# Patient Record
Sex: Female | Born: 1980 | Race: Black or African American | Hispanic: No | Marital: Single | Smoking: Never smoker
Health system: Southern US, Community
[De-identification: ages and names within clinical notes are randomized; demographics above are authoritative.]

---

## 2018-04-19 DIAGNOSIS — Z1388 Encounter for screening for disorder due to exposure to contaminants: Secondary | ICD-10-CM | POA: Diagnosis not present

## 2018-04-19 DIAGNOSIS — Z23 Encounter for immunization: Secondary | ICD-10-CM | POA: Diagnosis not present

## 2018-04-19 DIAGNOSIS — Z111 Encounter for screening for respiratory tuberculosis: Secondary | ICD-10-CM | POA: Diagnosis not present

## 2018-04-19 DIAGNOSIS — Z3009 Encounter for other general counseling and advice on contraception: Secondary | ICD-10-CM | POA: Diagnosis not present

## 2018-04-19 DIAGNOSIS — Z01419 Encounter for gynecological examination (general) (routine) without abnormal findings: Secondary | ICD-10-CM | POA: Diagnosis not present

## 2018-04-19 DIAGNOSIS — B373 Candidiasis of vulva and vagina: Secondary | ICD-10-CM | POA: Diagnosis not present

## 2018-04-19 DIAGNOSIS — Z049 Encounter for examination and observation for unspecified reason: Secondary | ICD-10-CM | POA: Diagnosis not present

## 2018-04-19 DIAGNOSIS — Z206 Contact with and (suspected) exposure to human immunodeficiency virus [HIV]: Secondary | ICD-10-CM | POA: Diagnosis not present

## 2018-04-19 DIAGNOSIS — Z0389 Encounter for observation for other suspected diseases and conditions ruled out: Secondary | ICD-10-CM | POA: Diagnosis not present

## 2018-05-05 ENCOUNTER — Other Ambulatory Visit: Payer: Self-pay | Admitting: Internal Medicine

## 2018-05-05 ENCOUNTER — Ambulatory Visit
Admission: RE | Admit: 2018-05-05 | Discharge: 2018-05-05 | Disposition: A | Discharge status: Home / Self Care | Payer: Self-pay | Source: Ambulatory Visit | Attending: Internal Medicine | Admitting: Internal Medicine

## 2018-05-05 DIAGNOSIS — Z3200 Encounter for pregnancy test, result unknown: Secondary | ICD-10-CM | POA: Diagnosis not present

## 2018-05-05 DIAGNOSIS — A150 Tuberculosis of lung: Secondary | ICD-10-CM

## 2018-05-05 DIAGNOSIS — Z30013 Encounter for initial prescription of injectable contraceptive: Secondary | ICD-10-CM | POA: Diagnosis not present

## 2018-05-06 ENCOUNTER — Ambulatory Visit
Admission: RE | Admit: 2018-05-06 | Discharge: 2018-05-06 | Disposition: A | Discharge status: Home / Self Care | Payer: Self-pay | Source: Ambulatory Visit | Attending: Internal Medicine | Admitting: Internal Medicine

## 2018-05-13 DIAGNOSIS — R7612 Nonspecific reaction to cell mediated immunity measurement of gamma interferon antigen response without active tuberculosis: Secondary | ICD-10-CM | POA: Diagnosis not present

## 2018-07-27 ENCOUNTER — Ambulatory Visit: Payer: No Typology Code available for payment source | Admitting: Family Medicine

## 2018-08-09 ENCOUNTER — Ambulatory Visit (INDEPENDENT_AMBULATORY_CARE_PROVIDER_SITE_OTHER): Payer: Medicaid Other | Admitting: Family Medicine

## 2018-08-09 ENCOUNTER — Encounter: Payer: Self-pay | Admitting: Family Medicine

## 2018-08-09 VITALS — BP 89/62 | HR 76 | Resp 17 | Ht 68.0 in | Wt 192.0 lb

## 2018-08-09 DIAGNOSIS — Z7689 Persons encountering health services in other specified circumstances: Secondary | ICD-10-CM

## 2018-08-09 DIAGNOSIS — M25511 Pain in right shoulder: Secondary | ICD-10-CM | POA: Diagnosis not present

## 2018-08-09 DIAGNOSIS — Z131 Encounter for screening for diabetes mellitus: Secondary | ICD-10-CM

## 2018-08-09 DIAGNOSIS — Z0000 Encounter for general adult medical examination without abnormal findings: Secondary | ICD-10-CM

## 2018-08-09 DIAGNOSIS — Z23 Encounter for immunization: Secondary | ICD-10-CM

## 2018-08-09 DIAGNOSIS — Z114 Encounter for screening for human immunodeficiency virus [HIV]: Secondary | ICD-10-CM

## 2018-08-09 DIAGNOSIS — Z1329 Encounter for screening for other suspected endocrine disorder: Secondary | ICD-10-CM | POA: Diagnosis not present

## 2018-08-09 DIAGNOSIS — Z13220 Encounter for screening for lipoid disorders: Secondary | ICD-10-CM | POA: Diagnosis not present

## 2018-08-09 MED ORDER — MELOXICAM 15 MG PO TABS: 15.0000 mg | ORAL_TABLET | Freq: Every day | ORAL | 0 refills | Status: DC | PRN

## 2018-08-09 MED ORDER — PREDNISONE 20 MG PO TABS: 20.0000 mg | ORAL_TABLET | Freq: Every day | ORAL | 0 refills | Status: AC

## 2018-08-09 NOTE — Patient Instructions (Addendum)
Thank you for choosing [facility] to [facility] your [facility]!    Maria Powell was seen by Joaquin CourtsKimberly Harris, FNP today.   Maria Powell's primary care provider is Bing NeighborsHarris, Kimberly S, FNP.   For the best care possible, you should try to see Joaquin CourtsKimberly Harris, FNP-C whenever you come to the clinic.   We look forward to seeing you again soon!  If you have any questions about your visit today, please call us at [phone] or feel free to reach your primary care provider via MyChart.      Shoulder Pain Many things can cause shoulder pain, including:  An injury.  Moving the arm in the same way again and again (overuse).  Joint pain (arthritis).  Follow these instructions at home: Take these actions to help with your pain:  Squeeze a soft ball or a foam pad as much as you can. This helps to prevent swelling. It also makes the arm stronger.  Take over-the-counter and prescription medicines only as told by your doctor.  If told, put ice on the area: ? Put ice in a plastic bag. ? Place a towel between your skin and the bag. ? Leave the ice on for 20 minutes, 2-3 times per day. Stop putting on ice if it does not help with the pain.  If you were given a shoulder sling or immobilizer: ? Wear it as told. ? Remove it to shower or bathe. ? Move your arm as little as possible. ? Keep your hand moving. This helps prevent swelling.  Contact a doctor if:  Your pain gets worse.  Medicine does not help your pain.  You have new pain in your arm, hand, or fingers. Get help right away if:  Your arm, hand, or fingers: ? Tingle. ? Are numb. ? Are swollen. ? Are painful. ? Turn white or blue. This information is not intended to replace advice given to you by your health care provider. Make sure you discuss any questions you have with your health care provider. Document Released: 02/25/2008 Document Revised: 05/04/2016 Document Reviewed: 01/01/2015 Elsevier  Interactive Patient Education  [employer]2018 Elsevier Inc.

## 2018-08-09 NOTE — Progress Notes (Signed)
Maria Powell, is a 37 y.o. female  UJW:[id number]  FAO:[id number]  DOB - 1981/05/19  CC:  Chief Complaint  Patient presents with  . Establish Care    no concerns. did want flu shot today       HPI: Maria Powell is a 37 y.o. female is here today to establish care.   Maria Powell does not have a problem list on file.    Today's visit:  [name] complains today of shoulder pain.  She works at a [employer]. She complains of on-going right sided shoulder pain.  Occasional numbness in digits which resolve once pain subsides. She has applied ice and this improves pain for short time.  She denies any prior injury. This pain is been present since she is been working at the [employer] the course of the last 3 months.  She feels that the pain is getting worse and is interfering with her ability to perform her work. Patient denies new headaches, chest pain, abdominal pain, nausea, new weakness , SOB, edema, or worrisome cough.  Current medications:No current outpatient medications on file.   Pertinent family medical history: Immigrant from [place]. No known family medical history.   No Known Allergies  Social History   Socioeconomic History  . Marital status: Single    Spouse name: Not on file  . Number of children: Not on file  . Years of education: Not on file  . Highest education level: Not on file  Occupational History  . Not on file  Social Needs  . Financial resource strain: Not on file  . Food insecurity:    Worry: Not on file    Inability: Not on file  . Transportation needs:    Medical: Not on file    Non-medical: Not on file  Tobacco Use  . Smoking status: Never Smoker  . Smokeless tobacco: Never Used  Substance and Sexual Activity  . Alcohol use: Never    Frequency: Never  . Drug use: Never  . Sexual activity: Yes    Partners: Male  Lifestyle  . Physical activity:    Days per week: Not on file    Minutes per session: Not on file  .  Stress: Not on file  Relationships  . Social connections:    Talks on phone: Not on file    Gets together: Not on file    Attends religious service: Not on file    Active member of club or organization: Not on file    Attends meetings of clubs or organizations: Not on file    Relationship status: Not on file  . Intimate partner violence:    Fear of current or ex partner: Not on file    Emotionally abused: Not on file    Physically abused: Not on file    Forced sexual activity: Not on file  Other Topics Concern  . Not on file  Social History Narrative  . Not on file    Review of Systems: Constitutional: Negative for fever, chills, diaphoresis, activity change, appetite change and fatigue. HENT: Negative for ear pain, nosebleeds, congestion, facial swelling, rhinorrhea, neck pain, neck stiffness and ear discharge.  Eyes: Negative for pain, discharge, redness, itching and visual disturbance. Respiratory: Negative for cough, choking, chest tightness, shortness of breath, wheezing and stridor.  Cardiovascular: Negative for chest pain, palpitations and leg swelling. Gastrointestinal: Negative for abdominal distention. Genitourinary: Negative for dysuria, urgency, frequency, hematuria, flank pain, decreased urine volume, difficulty urinating. Musculoskeletal: right shoulder pain. Right  arm and hand numbness intermittently Neurological: Negative for dizziness, tremors, seizures, syncope, facial asymmetry, speech difficulty, weakness, light-headedness, numbness and headaches.  Psychiatric/Behavioral: Negative for hallucinations, behavioral problems, confusion, dysphoric mood, decreased concentration and agitation.  Objective:   Vitals:   08/09/18 1132  BP: (!) 89/62  Pulse: 76  Resp: 17  SpO2: 98%    BP Readings from Last 3 Encounters:  08/09/18 (!) 89/62    Filed Weights   08/09/18 1132  Weight: 192 lb (87.1 kg)      Physical Exam: Constitutional: Patient appears  well-developed and well-nourished. No distress. HENT: Normocephalic, atraumatic, External right and left ear normal. Oropharynx is clear and moist.  Eyes: Conjunctivae and EOM are normal. PERRLA, no scleral icterus. Neck: Normal ROM. Neck supple. No JVD. No tracheal deviation. No thyromegaly. CVS: RRR, S1/S2 +, no murmurs, no gallops, no carotid bruit.  Pulmonary: Effort and breath sounds normal, no stridor, rhonchi, wheezes, rales.  Abdominal: Soft. BS +, no distension, tenderness, rebound or guarding.  Musculoskeletal: Normal range of motion. No edema and no tenderness.  Neuro: Alert. Normal muscle tone coordination. Normal gait. BUE and BLE strength 5/5. Bilateral hand grips symmetrical. No cranial nerve deficit. Skin: Skin is warm and dry. No rash noted. Not diaphoretic. No erythema. No pallor. Psychiatric: Normal mood and affect. Behavior, judgment, thought content normal.     Assessment and plan:  1. Encounter to establish care 2. Need for prophylactic vaccination and inoculation against influenza - Flu Vaccine QUAD 6+ mos PF IM (Fluarix Quad PF)  3. Lipid screening - Lipid Panel  4. Thyroid disorder screen - TSH  5. Diabetes mellitus screening - Hemoglobin A1c  6. Laboratory tests ordered as part of a complete physical exam (CPE) - Comprehensive metabolic panel - CBC with Differential - Urinalysis  7. Screening for HIV (human immunodeficiency virus) - HIV antibody (with reflex)  8. Acute pain of right shoulder, will trial prednisone taper initially and advised to start PRN Meloxicam once prednisone is finished. If no improvement with this course of treatment, will refer to orthopedic.  A total of 30 minutes spent, greater than 50 % of this time was spent counseling, coordination of care. Used  interpreter to facilitate continuum of care and promote health.   Meds ordered this encounter  Medications  . meloxicam (MOBIC) 15 MG tablet    Sig: Take 1 tablet (15 mg  total) by mouth daily as needed for pain. Start after prednisone is completed    Dispense:  30 tablet    Refill:  0  . predniSONE (DELTASONE) 20 MG tablet    Sig: Take 1 tablet (20 mg total) by mouth daily with breakfast for 5 days.    Dispense:  5 tablet    Refill:  0    F/u based on lab results  The patient was given clear instructions to go to ER or return to medical center if symptoms don't improve, worsen or new problems develop. The patient verbalized understanding. The patient was advised  to call and obtain lab results if they haven't heard anything from out office within 7-10 business days.  Joaquin CourtsKimberly Mady Oubre, FNP [facility] [address], [place] [id number] [phone]: [phone]    This note has been created with Dragon speech recognition software and Paediatric nursesmart phrase technology. Any transcriptional errors are unintentional.

## 2018-08-10 LAB — COMPREHENSIVE METABOLIC PANEL
ALT: 24 IU/L (ref 0–32)
AST: 22 IU/L (ref 0–40)
Albumin/Globulin Ratio: 1.1 — ABNORMAL LOW (ref 1.2–2.2)
Albumin: 4 g/dL (ref 3.5–5.5)
Alkaline Phosphatase: 57 IU/L (ref 39–117)
BUN/Creatinine Ratio: 23 (ref 9–23)
BUN: 15 mg/dL (ref 6–20)
Bilirubin Total: 0.5 mg/dL (ref 0.0–1.2)
CALCIUM: 8.9 mg/dL (ref 8.7–10.2)
CO2: 22 mmol/L (ref 20–29)
CREATININE: 0.65 mg/dL (ref 0.57–1.00)
Chloride: 102 mmol/L (ref 96–106)
GFR calc Af Amer: 131 mL/min/{1.73_m2} (ref >59)
GFR, EST NON AFRICAN AMERICAN: 114 mL/min/{1.73_m2} (ref >59)
Globulin, Total: 3.5 g/dL (ref 1.5–4.5)
Glucose: 100 mg/dL — ABNORMAL HIGH (ref 65–99)
POTASSIUM: 4.4 mmol/L (ref 3.5–5.2)
Sodium: 140 mmol/L (ref 134–144)
Total Protein: 7.5 g/dL (ref 6.0–8.5)

## 2018-08-10 LAB — CBC WITH DIFFERENTIAL/PLATELET
Basophils Absolute: 0 10*3/uL (ref 0.0–0.2)
Basos: 1 %
EOS (ABSOLUTE): 0.1 10*3/uL (ref 0.0–0.4)
EOS: 3 %
HEMATOCRIT: 38.7 % (ref 34.0–46.6)
Hemoglobin: 12.5 g/dL (ref 11.1–15.9)
IMMATURE GRANULOCYTES: 0 %
Immature Grans (Abs): 0 10*3/uL (ref 0.0–0.1)
Lymphocytes Absolute: 1.9 10*3/uL (ref 0.7–3.1)
Lymphs: 37 %
MCH: 25.5 pg — ABNORMAL LOW (ref 26.6–33.0)
MCHC: 32.3 g/dL (ref 31.5–35.7)
MCV: 79 fL (ref 79–97)
MONOCYTES: 11 %
MONOS ABS: 0.6 10*3/uL (ref 0.1–0.9)
NEUTROS PCT: 48 %
Neutrophils Absolute: 2.5 10*3/uL (ref 1.4–7.0)
Platelets: 451 10*3/uL — ABNORMAL HIGH (ref 150–450)
RBC: 4.9 x10E6/uL (ref 3.77–5.28)
RDW: 12.8 % (ref 12.3–15.4)
WBC: 5.2 10*3/uL (ref 3.4–10.8)

## 2018-08-10 LAB — URINALYSIS
Bilirubin, UA: NEGATIVE
Glucose, UA: NEGATIVE
KETONES UA: NEGATIVE
Leukocytes, UA: NEGATIVE
NITRITE UA: NEGATIVE
Protein, UA: NEGATIVE
Specific Gravity, UA: 1.026 (ref 1.005–1.030)
UUROB: 0.2 mg/dL (ref 0.2–1.0)
pH, UA: 5.5 (ref 5.0–7.5)

## 2018-08-10 LAB — HEMOGLOBIN A1C
ESTIMATED AVERAGE GLUCOSE: 137 mg/dL
HEMOGLOBIN A1C: 6.4 % — AB (ref 4.8–5.6)

## 2018-08-10 LAB — LIPID PANEL
CHOL/HDL RATIO: 4.1 ratio (ref 0.0–4.4)
Cholesterol, Total: 202 mg/dL — ABNORMAL HIGH (ref 100–199)
HDL: 49 mg/dL (ref >39)
LDL CALC: 131 mg/dL — AB (ref 0–99)
Triglycerides: 112 mg/dL (ref 0–149)
VLDL Cholesterol Cal: 22 mg/dL (ref 5–40)

## 2018-08-10 LAB — HIV ANTIBODY (ROUTINE TESTING W REFLEX): HIV SCREEN 4TH GENERATION: NONREACTIVE

## 2018-08-10 LAB — TSH: TSH: 1.32 u[IU]/mL (ref 0.450–4.500)

## 2018-08-16 ENCOUNTER — Telehealth: Payer: Self-pay | Admitting: Family Medicine

## 2018-08-16 NOTE — Telephone Encounter (Signed)
Please contact patient to schedule a return visit to discuss recent labs. Next week is great if possible with an interpreter.

## 2018-08-25 ENCOUNTER — Ambulatory Visit: Payer: Medicaid Other | Admitting: Family Medicine

## 2018-08-27 ENCOUNTER — Encounter: Payer: Self-pay | Admitting: Family Medicine

## 2018-08-27 NOTE — Progress Notes (Signed)
Letter mailed requesting patient to come in office to discuss labs. She was scheduled to be seen and no showed follow-up appt. Given language barrier wanted to discuss labs and make medication changes face to face.

## 2019-05-01 IMAGING — CR DG CHEST 1V
1 series · 1 of 1 positions shown · non-contrast
Comparison: None.

CLINICAL DATA: Positive PPD

EXAM:
CHEST  1 VIEW

[w chest pa]
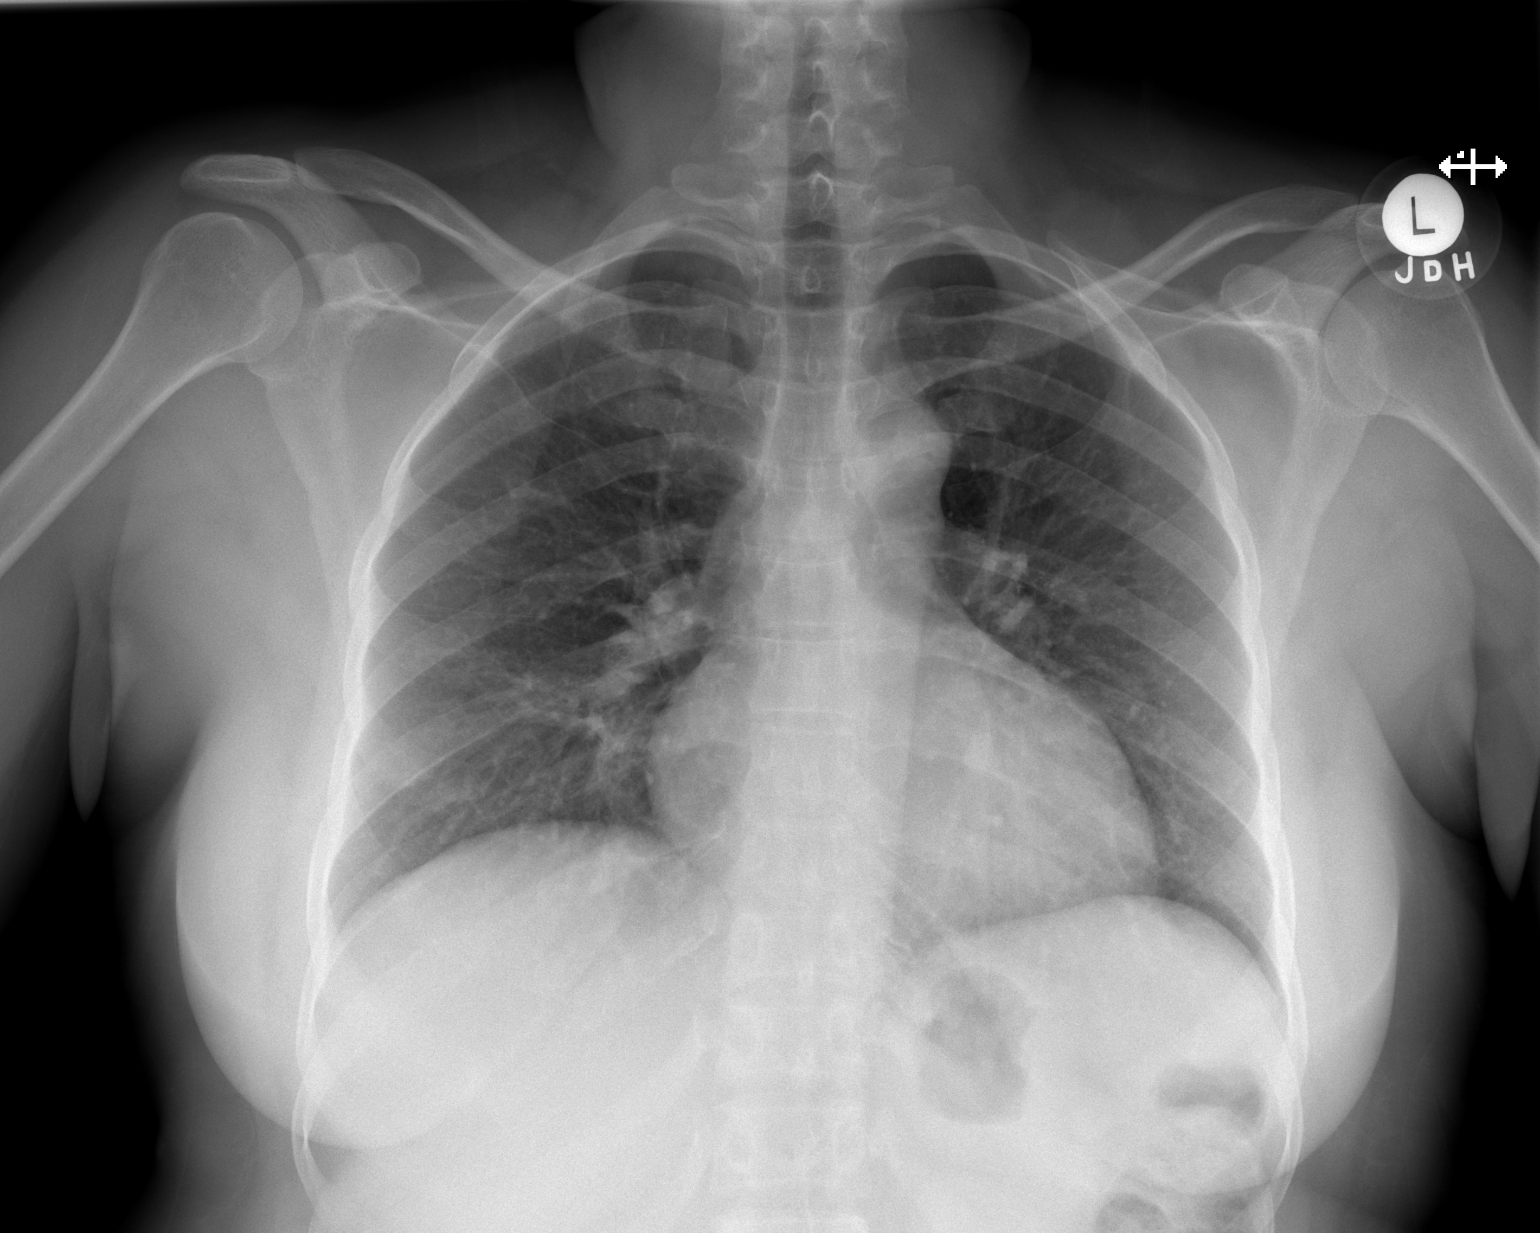

[1 of 1 positions shown; findings below may reference images not displayed]

FINDINGS: Heart and mediastinal contours are within normal limits. No focal
opacities or effusions. No acute bony abnormality.
IMPRESSION: No active disease.

## 2021-11-25 DIAGNOSIS — Z23 Encounter for immunization: Secondary | ICD-10-CM | POA: Diagnosis not present

## 2023-08-07 ENCOUNTER — Ambulatory Visit: Payer: Medicaid Other

## 2023-08-07 DIAGNOSIS — Z23 Encounter for immunization: Secondary | ICD-10-CM

## 2023-08-07 DIAGNOSIS — Z719 Counseling, unspecified: Secondary | ICD-10-CM

## 2023-08-07 NOTE — Progress Notes (Addendum)
In nurse clinic for immunizations for immigration.Accompanied by 3 children. Oldest son interpreted for pt. Declined interpreter services,signed declination. Vaccines given Flu,Covid, and Polio. Tolerated vaccines well. Observed following covid vaccine,no adverse effects noted.NCIR updated and copies given to pt.

## 2024-07-30 ENCOUNTER — Emergency Department (HOSPITAL_COMMUNITY)

## 2024-07-30 ENCOUNTER — Other Ambulatory Visit: Payer: Self-pay

## 2024-07-30 ENCOUNTER — Encounter (HOSPITAL_COMMUNITY): Payer: Self-pay | Admitting: Emergency Medicine

## 2024-07-30 ENCOUNTER — Inpatient Hospital Stay (HOSPITAL_COMMUNITY)
Admission: EM | Admit: 2024-07-30 | Discharge: 2024-08-02 | DRG: 872 | Disposition: A | Discharge status: Home / Self Care | Attending: Family Medicine | Admitting: Family Medicine

## 2024-07-30 DIAGNOSIS — N12 Tubulo-interstitial nephritis, not specified as acute or chronic: Secondary | ICD-10-CM | POA: Diagnosis not present

## 2024-07-30 DIAGNOSIS — Z791 Long term (current) use of non-steroidal anti-inflammatories (NSAID): Secondary | ICD-10-CM

## 2024-07-30 DIAGNOSIS — R1084 Generalized abdominal pain: Secondary | ICD-10-CM | POA: Diagnosis not present

## 2024-07-30 DIAGNOSIS — E785 Hyperlipidemia, unspecified: Secondary | ICD-10-CM | POA: Diagnosis present

## 2024-07-30 DIAGNOSIS — Z1152 Encounter for screening for COVID-19: Secondary | ICD-10-CM | POA: Diagnosis not present

## 2024-07-30 DIAGNOSIS — E860 Dehydration: Secondary | ICD-10-CM | POA: Diagnosis present

## 2024-07-30 DIAGNOSIS — R7303 Prediabetes: Secondary | ICD-10-CM | POA: Insufficient documentation

## 2024-07-30 DIAGNOSIS — A419 Sepsis, unspecified organism: Principal | ICD-10-CM | POA: Diagnosis present

## 2024-07-30 DIAGNOSIS — R0989 Other specified symptoms and signs involving the circulatory and respiratory systems: Secondary | ICD-10-CM | POA: Diagnosis not present

## 2024-07-30 DIAGNOSIS — M549 Dorsalgia, unspecified: Secondary | ICD-10-CM | POA: Diagnosis not present

## 2024-07-30 DIAGNOSIS — R519 Headache, unspecified: Secondary | ICD-10-CM | POA: Diagnosis not present

## 2024-07-30 DIAGNOSIS — R112 Nausea with vomiting, unspecified: Secondary | ICD-10-CM | POA: Diagnosis not present

## 2024-07-30 DIAGNOSIS — R935 Abnormal findings on diagnostic imaging of other abdominal regions, including retroperitoneum: Secondary | ICD-10-CM | POA: Diagnosis not present

## 2024-07-30 DIAGNOSIS — N10 Acute tubulo-interstitial nephritis: Secondary | ICD-10-CM | POA: Diagnosis not present

## 2024-07-30 DIAGNOSIS — K5900 Constipation, unspecified: Secondary | ICD-10-CM | POA: Diagnosis not present

## 2024-07-30 DIAGNOSIS — E119 Type 2 diabetes mellitus without complications: Secondary | ICD-10-CM | POA: Diagnosis present

## 2024-07-30 DIAGNOSIS — R000 Tachycardia, unspecified: Secondary | ICD-10-CM | POA: Diagnosis not present

## 2024-07-30 DIAGNOSIS — R140 Abdominal distension (gaseous): Secondary | ICD-10-CM | POA: Diagnosis not present

## 2024-07-30 LAB — CBC WITH DIFFERENTIAL/PLATELET
Abs Immature Granulocytes: 0.15 K/uL — ABNORMAL HIGH (ref 0.00–0.07)
Basophils Absolute: 0.1 K/uL (ref 0.0–0.1)
Basophils Relative: 0 %
Eosinophils Absolute: 0 K/uL (ref 0.0–0.5)
Eosinophils Relative: 0 %
HCT: 36.2 % (ref 36.0–46.0)
Hemoglobin: 11.6 g/dL — ABNORMAL LOW (ref 12.0–15.0)
Immature Granulocytes: 1 %
Lymphocytes Relative: 7 %
Lymphs Abs: 1.3 K/uL (ref 0.7–4.0)
MCH: 26.3 pg (ref 26.0–34.0)
MCHC: 32 g/dL (ref 30.0–36.0)
MCV: 82.1 fL (ref 80.0–100.0)
Monocytes Absolute: 2.3 K/uL — ABNORMAL HIGH (ref 0.1–1.0)
Monocytes Relative: 13 %
Neutro Abs: 14.2 K/uL — ABNORMAL HIGH (ref 1.7–7.7)
Neutrophils Relative %: 79 %
Platelets: 293 K/uL (ref 150–400)
RBC: 4.41 MIL/uL (ref 3.87–5.11)
RDW: 13.2 % (ref 11.5–15.5)
WBC: 18 K/uL — ABNORMAL HIGH (ref 4.0–10.5)
nRBC: 0 % (ref 0.0–0.2)

## 2024-07-30 LAB — GLUCOSE, CAPILLARY
Glucose-Capillary: 135 mg/dL — ABNORMAL HIGH (ref 70–99)
Glucose-Capillary: 138 mg/dL — ABNORMAL HIGH (ref 70–99)

## 2024-07-30 LAB — RESP PANEL BY RT-PCR (RSV, FLU A&B, COVID)  RVPGX2
Influenza A by PCR: NEGATIVE
Influenza B by PCR: NEGATIVE
Resp Syncytial Virus by PCR: NEGATIVE
SARS Coronavirus 2 by RT PCR: NEGATIVE

## 2024-07-30 LAB — COMPREHENSIVE METABOLIC PANEL WITH GFR
ALT: 37 U/L (ref 0–44)
AST: 38 U/L (ref 15–41)
Albumin: 2.5 g/dL — ABNORMAL LOW (ref 3.5–5.0)
Alkaline Phosphatase: 125 U/L (ref 38–126)
Anion gap: 13 (ref 5–15)
BUN: 14 mg/dL (ref 6–20)
CO2: 21 mmol/L — ABNORMAL LOW (ref 22–32)
Calcium: 8.5 mg/dL — ABNORMAL LOW (ref 8.9–10.3)
Chloride: 102 mmol/L (ref 98–111)
Creatinine, Ser: 1 mg/dL (ref 0.44–1.00)
GFR, Estimated: >60 mL/min (ref >60)
Glucose, Bld: 136 mg/dL — ABNORMAL HIGH (ref 70–99)
Potassium: 4.2 mmol/L (ref 3.5–5.1)
Sodium: 136 mmol/L (ref 135–145)
Total Bilirubin: 0.6 mg/dL (ref 0.0–1.2)
Total Protein: 7.2 g/dL (ref 6.5–8.1)

## 2024-07-30 LAB — URINALYSIS, W/ REFLEX TO CULTURE (INFECTION SUSPECTED)
Bacteria, UA: NONE SEEN
Bilirubin Urine: NEGATIVE
Glucose, UA: NEGATIVE mg/dL
Hgb urine dipstick: NEGATIVE
Ketones, ur: NEGATIVE mg/dL
Nitrite: NEGATIVE
Protein, ur: 30 mg/dL — AB
Specific Gravity, Urine: 1.011 (ref 1.005–1.030)
pH: 7 (ref 5.0–8.0)

## 2024-07-30 LAB — HEMOGLOBIN A1C
Hgb A1c MFr Bld: 6.6 % — ABNORMAL HIGH (ref 4.8–5.6)
Mean Plasma Glucose: 142.72 mg/dL

## 2024-07-30 LAB — PROTIME-INR
INR: >10 (ref 0.8–1.2)
INR: 1.2 (ref 0.8–1.2)
Prothrombin Time: >90 s — ABNORMAL HIGH (ref 11.4–15.2)
Prothrombin Time: 15.4 s — ABNORMAL HIGH (ref 11.4–15.2)

## 2024-07-30 LAB — HCG, SERUM, QUALITATIVE: Preg, Serum: NEGATIVE

## 2024-07-30 LAB — I-STAT CG4 LACTIC ACID, ED
Lactic Acid, Venous: 1.2 mmol/L (ref 0.5–1.9)
Lactic Acid, Venous: 1.3 mmol/L (ref 0.5–1.9)

## 2024-07-30 LAB — HIV ANTIBODY (ROUTINE TESTING W REFLEX): HIV Screen 4th Generation wRfx: NONREACTIVE

## 2024-07-30 MED ORDER — LACTATED RINGERS IV SOLN: INTRAVENOUS | Status: DC

## 2024-07-30 MED ORDER — ENOXAPARIN SODIUM 40 MG/0.4ML IJ SOSY
40.0000 mg | PREFILLED_SYRINGE | INTRAMUSCULAR | Status: DC
  Filled 2024-07-30: qty 0.4

## 2024-07-30 MED ORDER — INSULIN ASPART 100 UNIT/ML IJ SOLN
0.0000 [IU] | Freq: Three times a day (TID) | INTRAMUSCULAR | Status: DC
  Administered 2024-07-30: 1 [IU] via SUBCUTANEOUS
  Filled 2024-07-30: qty 1

## 2024-07-30 MED ORDER — METRONIDAZOLE 500 MG/100ML IV SOLN
500.0000 mg | Freq: Once | INTRAVENOUS | Status: AC
  Administered 2024-07-30: 500 mg via INTRAVENOUS
  Filled 2024-07-30: qty 100

## 2024-07-30 MED ORDER — LACTATED RINGERS IV BOLUS (SEPSIS)
1000.0000 mL | Freq: Once | INTRAVENOUS | Status: AC
  Administered 2024-07-30: 1000 mL via INTRAVENOUS

## 2024-07-30 MED ORDER — MORPHINE SULFATE (PF) 4 MG/ML IV SOLN
4.0000 mg | Freq: Once | INTRAVENOUS | Status: AC
  Administered 2024-07-30: 4 mg via INTRAVENOUS
  Filled 2024-07-30: qty 1

## 2024-07-30 MED ORDER — SODIUM CHLORIDE 0.9 % IV SOLN
2.0000 g | Freq: Once | INTRAVENOUS | Status: AC
  Administered 2024-07-30: 2 g via INTRAVENOUS
  Filled 2024-07-30: qty 12.5

## 2024-07-30 MED ORDER — MORPHINE SULFATE (PF) 4 MG/ML IV SOLN: 4.0000 mg | INTRAVENOUS | Status: DC | PRN

## 2024-07-30 MED ORDER — ONDANSETRON HCL 4 MG/2ML IJ SOLN: 4.0000 mg | Freq: Three times a day (TID) | INTRAMUSCULAR | Status: DC | PRN

## 2024-07-30 MED ORDER — OXYCODONE HCL 5 MG PO TABS
5.0000 mg | ORAL_TABLET | Freq: Four times a day (QID) | ORAL | Status: DC | PRN
  Administered 2024-07-30 – 2024-08-01 (×6): 5 mg via ORAL
  Filled 2024-07-30 (×6): qty 1

## 2024-07-30 MED ORDER — IOHEXOL 350 MG/ML SOLN
75.0000 mL | Freq: Once | INTRAVENOUS | Status: AC | PRN
  Administered 2024-07-30: 75 mL via INTRAVENOUS

## 2024-07-30 MED ORDER — ACETAMINOPHEN 500 MG PO TABS
1000.0000 mg | ORAL_TABLET | Freq: Four times a day (QID) | ORAL | Status: DC
  Administered 2024-07-30 – 2024-08-02 (×10): 1000 mg via ORAL
  Filled 2024-07-30 (×10): qty 2

## 2024-07-30 MED ORDER — KETOROLAC TROMETHAMINE 15 MG/ML IJ SOLN
15.0000 mg | Freq: Once | INTRAMUSCULAR | Status: AC
  Administered 2024-07-30: 15 mg via INTRAVENOUS
  Filled 2024-07-30: qty 1

## 2024-07-30 MED ORDER — SODIUM CHLORIDE 0.9 % IV SOLN
2.0000 g | INTRAVENOUS | Status: DC
  Administered 2024-07-30 – 2024-08-01 (×3): 2 g via INTRAVENOUS
  Filled 2024-07-30 (×3): qty 20

## 2024-07-30 NOTE — H&P (Signed)
 Hospital Admission History and Physical Service Pager: [phone]  Patient name: Maria Powell Medical record number: [id number] Date of Birth: [birth year] Age: 43 y.o. Gender: female  Primary Care Provider: Arloa Suzen RAMAN, NP (Inactive) Consultants: None Code Status: Full code Preferred Emergency Contact:  Laureen (son - 33 yo) - [phone] (does speak English) Trenesha Alcaide (son - 30 yo) - [phone] (does speak English) Contact Information     Name Relation Home Work Mobile   Chhetri,Birendra Friend   [phone]      Other Contacts   None on File      Chief Complaint: Abdominal pain  Assessment and Plan: Madia Carvell is a 43 y.o. female with PMHx prediabetes and HLD presenting with abdominal pain and nausea meeting sepsis criteria.   Differential for presentation of this includes: Pyelonephritis: CTAP confirmed bilateral pyelonephritis and patient with dysuria x 1 week.  Most likely source Meningitis: Endorsing headache, likely in the setting of dehydration as negative for meningeal signs and with other more likely source Pneumonia: Denies cough congestion or other respiratory symptoms, unlikely.  Assessment & Plan Sepsis (HCC) Bilateral Pyelonephritis Meets sepsis criteria given can tachycardia, leukocytosis and softer BP with bilateral pyelonephritis as likely source.  S/p cefepime and Flagyl per the ED, will consolidate to ceftriaxone and narrow as able pending blood and urine cultures.  Ongoing nausea, will treat symptomatically and continue MIVF. - Admitted to FMTS, Dr. Anders attending - S/p cefepime/Flagyl (11/8), transition to ceftriaxone (11/9-) - Follow-up blood and urine culture - Pain management: Scheduled Tylenol 1G every 6 hour, oxycodone 5 mg as needed every 6 hour moderate/severe pain, morphine 4 mg IV as needed every 4 hours breakthrough pain - Zofran 4 mg IV every 8 hours as needed - Continue LR MIVF, d/c when taking  PO Prediabetes A1c 6.4 in 2019, possibly progressed to T2DM as a risk factor for pyelonephritis as above. - A1c - CBGs with sSSI   FEN/GI: Clear liquid VTE Prophylaxis: Lovenox  Disposition: Progressive  History of Present Illness:  Maria Powell is a 43 y.o. female presenting with abdominal pain  Abdominal pain and nausea x 2 days. Some HA in the same time period with global weakness. Having dysuria x 1 week. No diarrhea. Had some vomiting at home. Denies hematuria. Having chills but denies fever. Not eating and drinking much. No similar prior presentation. Denies history of urinary retention.   In the ED, met criteria for sepsis given tachycardia, soft BP and leukocytosis.  CTAP obtained that showed bilateral pyelonephritis initiated on cefepime and Flagyl.  Obtained blood culture and urine culture.  S/p LR 1L bolus x 3 and started on MIVF.  Concern for urinary retention, Foley catheter placed.  Review Of Systems: Per HPI  Pertinent Past Medical History: Prediabetes HLD Refugee Remainder reviewed in history tab.   Pertinent Past Surgical History: C/S x 2 Remainder reviewed in history tab.   Pertinent Social History: Tobacco use: Never Alcohol use: None Other Substance use: None Lives with children (20, 31, 39, 67 yo)  Pertinent Family History: Denies h/o renal disease, DM and heart disease Remainder reviewed in history tab.   Important Outpatient Medications: None Remainder reviewed in medication history.   Objective: BP 104/82   Pulse (!) 104   Temp 99.2 F (37.3 C)   Resp 16   SpO2 100%  Exam: General: Ill-appearing, NAD Eyes: Anicteric sclera.  PERRLA.  EOMI. ENTM: Dry mucous membranes.  Poor dentition. Neck: Full ROM of neck without pain. Cardiovascular:  Tachycardia.  Regular rhythm.  No murmur. Respiratory: CTAB.  Normal work of breathing on room air. Gastrointestinal: Soft, nondistended.  Tender to palpation over lower abdomen extending around to  the back.  + CVA tenderness bilaterally.  No rebound tenderness or guarding MSK: No peripheral edema Derm: Warm, dry, no rashes Neuro: CN intact.  Motor and sensation intact globally.  5/5 grip strength and hip flexion bilaterally Psych: Fatigued.  Appropriately responds to questions  Labs:  CBC BMET  Recent Labs  Lab 07/30/24 0831  WBC 18.0*  HGB 11.6*  HCT 36.2  PLT 293   Recent Labs  Lab 07/30/24 0831  NA 136  K 4.2  CL 102  CO2 21*  BUN 14  CREATININE 1.00  GLUCOSE 136*  CALCIUM 8.5*    Pertinent additional labs: Lactic acid: 1.2 > 1.3  EKG: Sinus tachycardia.    Imaging Studies Performed: CT CHEST ABDOMEN PELVIS W CONTRAST Result Date: 07/30/2024 IMPRESSION: 1. Bilateral Acute Pyelonephritis, severe on the right, with inflammation tracking distally along the right ureter. No renal abscess. No urinary calculus or obstructing etiology identified. 2. Urinary bladder distended (552 mL), query urinary retention. 3. Bilateral lung opacity more resembling atelectasis than infection. 4. Mild cardiomegaly. Electronically signed by: Helayne Hurst MD 07/30/2024 12:31 PM EST RP Workstation: [id number]   CT Head Wo Contrast Result Date: 07/30/2024 IMPRESSION: 1. Normal non-contrast head CT.      Theophilus Pagan, MD 07/30/2024, 1:10 PM PGY-3, [facility]  FPTS Intern pager: [phone], text pages welcome Secure chat group [facility] Teaching Service

## 2024-07-30 NOTE — Plan of Care (Signed)
 FMTS Interim Progress Note  S: Night rounding. No acute complaints.  O: BP 93/61 (BP Location: Left Arm)   Pulse 92   Temp 99.4 F (37.4 C) (Oral)   Resp 18   SpO2 97%   Gen: Woman laying in bed, resting comfortably. NAD. On RA.   A/P: Sepsis  Pyelonephritis VSS, afebrile. Plan to transition to CTX tomrrow. Ucx and Bcx pending.   Elicia Hamlet, MD 07/30/2024, 9:45 PM PGY-3, [facility] Family Medicine Service pager [phone]

## 2024-07-30 NOTE — ED Notes (Signed)
 Bed side commode placed in room so we can get a urin sample

## 2024-07-30 NOTE — ED Notes (Signed)
 Pt in CT.

## 2024-07-30 NOTE — Plan of Care (Signed)
   Problem: Education: Goal: Ability to describe self-care measures that may prevent or decrease complications (Diabetes Survival Skills Education) will improve Outcome: Progressing

## 2024-07-30 NOTE — ED Notes (Signed)
 Bladder scan 79mL, decreased visibility due to abdominal swelling.

## 2024-07-30 NOTE — Assessment & Plan Note (Signed)
 A1c 6.4 in 2019, possibly progressed to T2DM as a risk factor for pyelonephritis as above. - A1c - CBGs with sSSI

## 2024-07-30 NOTE — Assessment & Plan Note (Signed)
 Meets sepsis criteria given can tachycardia, leukocytosis and softer BP with bilateral pyelonephritis as likely source.  S/p cefepime and Flagyl per the ED, will consolidate to ceftriaxone and narrow as able pending blood and urine cultures.  Ongoing nausea, will treat symptomatically and continue MIVF. - Admitted to FMTS, Dr. Anders attending - S/p cefepime/Flagyl (11/8), transition to ceftriaxone (11/9-) - Follow-up blood and urine culture - Pain management: Scheduled Tylenol 1G every 6 hour, oxycodone 5 mg as needed every 6 hour moderate/severe pain, morphine 4 mg IV as needed every 4 hours breakthrough pain - Zofran 4 mg IV every 8 hours as needed - Continue LR MIVF, d/c when taking PO

## 2024-07-30 NOTE — ED Notes (Signed)
 Red and light green tubes sent down to lab for hcg.

## 2024-07-30 NOTE — Sepsis Progress Note (Signed)
 Elink following code stroke

## 2024-07-30 NOTE — ED Provider Notes (Signed)
 [place] EMERGENCY DEPARTMENT AT [facility] Provider Note   CSN: [id number] Arrival date & time: 07/30/24  9192     Patient presents with: Abdominal Pain   Maria Powell is a 43 y.o. female with past medical history significant for previous C-section, otherwise noncontributory who presents with concern for 2 days of abdominal pain, nausea, vomiting, she denies diarrhea.  She does feel some fever, chills.  She endorses some headache, denies vision changes, numbness, tingling.  She denies any neck pain.  She does endorse some dysuria.    Abdominal Pain      Prior to Admission medications   Medication Sig Start Date End Date Taking? Authorizing Provider  meloxicam  (MOBIC ) 15 MG tablet Take 1 tablet (15 mg total) by mouth daily as needed for pain. Start after prednisone  is completed Patient not taking: Reported on 08/07/2023 08/09/18   Arloa Suzen RAMAN, NP    Allergies: Patient has no known allergies.    Review of Systems  Gastrointestinal:  Positive for abdominal pain.  All other systems reviewed and are negative.   Updated Vital Signs BP 104/82   Pulse (!) 104   Temp 99.2 F (37.3 C)   Resp 16   SpO2 100%   Physical Exam Vitals and nursing note reviewed.  Constitutional:      General: She is not in acute distress.    Appearance: Normal appearance.  HENT:     Head: Normocephalic and atraumatic.  Eyes:     General:        Right eye: No discharge.        Left eye: No discharge.  Cardiovascular:     Rate and Rhythm: Normal rate and regular rhythm.     Heart sounds: No murmur heard.    No friction rub. No gallop.  Pulmonary:     Effort: Pulmonary effort is normal.     Breath sounds: Normal breath sounds.  Abdominal:     General: Bowel sounds are normal.     Palpations: Abdomen is soft.     Comments: Diffuse tenderness to palpation throughout the abdomen, most focally in the left lower quadrant, epigastric region, no rebound, rigidity,  guarding  Skin:    General: Skin is warm and dry.     Capillary Refill: Capillary refill takes less than 2 seconds.  Neurological:     Mental Status: She is alert and oriented to person, place, and time.  Psychiatric:        Mood and Affect: Mood normal.        Behavior: Behavior normal.     (all labs ordered are listed, but only abnormal results are displayed) Labs Reviewed  COMPREHENSIVE METABOLIC PANEL WITH GFR - Abnormal; Notable for the following components:      Result Value   CO2 21 (*)    Glucose, Bld 136 (*)    Calcium 8.5 (*)    Albumin 2.5 (*)    All other components within normal limits  CBC WITH DIFFERENTIAL/PLATELET - Abnormal; Notable for the following components:   WBC 18.0 (*)    Hemoglobin 11.6 (*)    Neutro Abs 14.2 (*)    Monocytes Absolute 2.3 (*)    Abs Immature Granulocytes 0.15 (*)    All other components within normal limits  PROTIME-INR - Abnormal; Notable for the following components:   Prothrombin Time >90.0 (*)    INR >10.0 (*)    All other components within normal limits  URINALYSIS, W/ REFLEX TO  CULTURE (INFECTION SUSPECTED) - Abnormal; Notable for the following components:   Protein, ur 30 (*)    Leukocytes,Ua LARGE (*)    All other components within normal limits  PROTIME-INR - Abnormal; Notable for the following components:   Prothrombin Time 15.4 (*)    All other components within normal limits  RESP PANEL BY RT-PCR (RSV, FLU A&B, COVID)  [id number]  CULTURE, BLOOD (ROUTINE X 2)  CULTURE, BLOOD (ROUTINE X 2)  URINE CULTURE  HCG, SERUM, QUALITATIVE  I-STAT CG4 LACTIC ACID, ED  I-STAT CG4 LACTIC ACID, ED    EKG: EKG Interpretation Date/Time:  Saturday July 30 2024 08:40:56 EST Ventricular Rate:  104 PR Interval:  149 QRS Duration:  90 QT Interval:  342 QTC Calculation: 450 R Axis:   34  Text Interpretation: Sinus tachycardia Borderline T abnormalities, diffuse leads Confirmed by Laurice Coy [phone]) on 07/30/2024 10:07:53  AM  Radiology: CT CHEST ABDOMEN PELVIS W CONTRAST Result Date: 07/30/2024 EXAM: CT CHEST, ABDOMEN AND PELVIS WITH CONTRAST 07/30/2024 12:03:28 PM TECHNIQUE: CT of the chest, abdomen and pelvis was performed with the administration of 75mL iohexol (OMNIPAQUE) 350 MG/ML injection. Multiplanar reformatted images are provided for review. Automated exposure control, iterative reconstruction, and/or weight based adjustment of the mA/kV was utilized to reduce the radiation dose to as low as reasonably achievable. COMPARISON: None available. CLINICAL HISTORY: 43 year old female with possible sepsis, nausea, vomiting, and abdominal pain. FINDINGS: CHEST: MEDIASTINUM AND LYMPH NODES: Mild cardiomegaly. No pericardial effusion. The central airways are clear. No mediastinal, hilar or axillary lymphadenopathy. LUNGS AND PLEURA: Somewhat low lung volumes and mild respiratory motion. Atelectatic changes to the major airways which remain patent. Streaky bilateral perihilar and dependent lung opacity more resembles atelectasis than infection. The most confluent lower lobe atelectasis is enhancing compatible with atelectasis. No pleural effusion or pneumothorax. ABDOMEN AND PELVIS: LIVER: The liver is unremarkable. GALLBLADDER AND BILE DUCTS: Gallbladder is unremarkable. No biliary ductal dilatation. SPLEEN: No acute abnormality. PANCREAS: No acute abnormality. ADRENAL GLANDS: No acute abnormality. KIDNEYS, URETERS AND BLADDER: Abnormal bilateral renal enhancement, worse on the right side. Striated nephrogram and areas of right renal lobar nephronia suspected (series 3 image 57). Moderate to severe right and moderate left perinephric space inflammation and edema. Abnormal urothelial thickening and enhancement also more pronounced on the right. No organized or drainable fluid collection associated with the kidneys. There is confluent right periureteral fluid tracking distally toward the pelvis. However, the distal right ureters  are diminutive and difficult to identify. No urinary calculus or obstructing etiology is identified. The urinary bladder stimated volume: 558 mL. GI AND BOWEL: Stomach demonstrates no acute abnormality. Nondilated small bowel. Redundant but mostly decompressed distal large bowel. Right colon, transverse colon and splenic flexure mild to moderate and retained stool. Normal gas containing appendix on series 3 image 76. There is no bowel obstruction. REPRODUCTIVE ORGANS: No acute abnormality. PERITONEUM AND RETROPERITONEUM: No ascites. No free air. VASCULATURE: Major arterial structures in the abdomen and pelvis, portal venous system appear patent and within normal limits. ABDOMINAL AND PELVIS LYMPH NODES: No lymphadenopathy. BONES AND SOFT TISSUES: Normal thoracic and lumbar segmentation. No pelvis recluse. No acute osseous abnormality. No focal soft tissue abnormality. IMPRESSION: 1. Bilateral Acute Pyelonephritis, severe on the right, with inflammation tracking distally along the right ureter. No renal abscess. No urinary calculus or obstructing etiology identified. 2. Urinary bladder distended (552 mL), query urinary retention. 3. Bilateral lung opacity more resembling atelectasis than infection. 4. Mild cardiomegaly. Electronically signed by: Helayne  Hall MD 07/30/2024 12:31 PM EST RP Workstation: [id number]   CT Head Wo Contrast Result Date: 07/30/2024 EXAM: CT HEAD WITHOUT CONTRAST 07/30/2024 12:03:28 PM TECHNIQUE: CT of the head was performed without the administration of intravenous contrast. Automated exposure control, iterative reconstruction, and/or weight based adjustment of the mA/kV was utilized to reduce the radiation dose to as low as reasonably achievable. COMPARISON: None available. CLINICAL HISTORY: 43 year old female with possible sepsis, headache, nausea, and vomiting. FINDINGS: BRAIN AND VENTRICLES: Brain volume is within normal limits. No suspicious intracranial vascular hyperdensity. No acute  hemorrhage. No evidence of acute infarct. No hydrocephalus. No extra-axial collection. No mass effect or midline shift. ORBITS: No acute abnormality. SINUSES: Paranasal sinuses, middle ears and mastoids are well aerated. SOFT TISSUES AND SKULL: No acute soft tissue abnormality. No skull fracture. IMPRESSION: 1. Normal non-contrast head CT. Electronically signed by: Helayne Hurst MD 07/30/2024 12:18 PM EST RP Workstation: [id number]     .Critical Care  Performed by: Rosan Sherlean DEL, PA-C Authorized by: Rosan Sherlean DEL, PA-C   Critical care provider statement:    Critical care time (minutes):  35   Critical care was necessary to treat or prevent imminent or life-threatening deterioration of the following conditions:  Sepsis   Critical care was time spent personally by me on the following activities:  Development of treatment plan with patient or surrogate, discussions with consultants, evaluation of patient's response to treatment, examination of patient, ordering and review of laboratory studies, ordering and review of radiographic studies, ordering and performing treatments and interventions, pulse oximetry, re-evaluation of patient's condition and review of old charts   Care discussed with: admitting provider      Medications Ordered in the ED  lactated ringers infusion ( Intravenous New Bag/Given 07/30/24 1246)  ketorolac (TORADOL) 15 MG/ML injection 15 mg (has no administration in time range)  morphine (PF) 4 MG/ML injection 4 mg (has no administration in time range)  lactated ringers bolus 1,000 mL (0 mLs Intravenous Stopped 07/30/24 0951)    And  lactated ringers bolus 1,000 mL (0 mLs Intravenous Stopped 07/30/24 1124)    And  lactated ringers bolus 1,000 mL (0 mLs Intravenous Stopped 07/30/24 1254)  ceFEPIme (MAXIPIME) 2 g in sodium chloride 0.9 % 100 mL IVPB (0 g Intravenous Stopped 07/30/24 0951)  metroNIDAZOLE (FLAGYL) IVPB 500 mg (0 mg Intravenous Stopped 07/30/24 0956)   morphine (PF) 4 MG/ML injection 4 mg (4 mg Intravenous Given 07/30/24 1014)  iohexol (OMNIPAQUE) 350 MG/ML injection 75 mL (75 mLs Intravenous Contrast Given 07/30/24 1204)    Clinical Course as of 07/30/24 1313  Sat Jul 30, 2024  1050 INR(!!): >10.0 Suspect that this value may be incorrect given her normal liver function, normal platelets, very mild anemia, and lack of evidence of severe bleeding throughout, will redraw [CP]  1152 INR: 1.2 PT/INR is within normal range which is closer to suspected result as there was no evidence of coagulopathy in this patient [CP]    Clinical Course User Index [CP] Rosan Sherlean DEL, PA-C                                 Medical Decision Making Amount and/or Complexity of Data Reviewed Labs: ordered. Decision-making details documented in ED Course. Radiology: ordered.  Risk Prescription drug management.   This patient is a 43 y.o. female  who presents to the ED for concern of abdominal pain.  Differential diagnoses prior to evaluation: The emergent differential diagnosis includes, but is not limited to,  The causes of generalized abdominal pain include but are not limited to AAA, mesenteric ischemia, appendicitis, diverticulitis, DKA, gastritis, gastroenteritis, AMI, nephrolithiasis, pancreatitis, peritonitis, adrenal insufficiency,lead poisoning, iron toxicity, intestinal ischemia, constipation, UTI,SBO/LBO, splenic rupture, biliary disease, IBD, IBS, PUD, or hepatitis, Ectopic pregnancy, ovarian torsion, PID. SABRA This is not an exhaustive differential.   Physical Exam: Physical exam performed. The pertinent findings include: Diffuse tenderness to palpation throughout the abdomen, most focally in the left lower quadrant, epigastric region, no rebound, rigidity, guarding   Borderline septic vitals on arrival, soft blood pressure, 98/62, temp 99.2, pulse 114, respirations 24  Lab Tests/Imaging studies: I personally interpreted labs/imaging and  the pertinent results include: CBC notable for leukocytosis, blood cells 18.0, hemoglobin 11.6.  CMP notable for mild bicarb deficit, CO2 21, mild hyperglycemia, glucose 136.  COVID, flu, RSV are all negative.  Normal lactic acid.  Her initial PT/INR was greater than 10, but due to suspicion that this was spurious in context of no elevated liver enzymes or evidence of coagulopathy who performed repeat which was within normal range, PT was very mildly elevated 15.4 but normal INR at 1.2.  CT head without contrast with no evidence of acute intracranial abnormality.  CT chest abdomen pelvis with contrast shows bilateral pyelonephritis, urinary retention. I agree with the radiologist interpretation.  Cardiac monitoring: EKG obtained and interpreted by myself and attending physician which shows: Sinus tachycardia, borderline T wave abnormality   Medications: I ordered medication including 30 cc/kg fluid bolus, cefepime, Flagyl for unidentified abdominal source on arrival.  I have reviewed the patients home medicines and have made adjustments as needed.   Consults: I spoke with the hospitalist, with family medicine service who agrees to admission for bilateral pyelonephritis, urinary retention, sepsis  Disposition: After consideration of the diagnostic results and the patients response to treatment, I feel that patient would benefit from admission for bilateral pyelonephritis as discussed above.   Final diagnoses:  None    ED Discharge Orders     None          Rosan Sherlean VEAR DEVONNA 07/30/24 1313    Laurice Maude BROCKS, MD 07/30/24 1355

## 2024-07-30 NOTE — Plan of Care (Signed)
  Problem: Education: Goal: Ability to describe self-care measures that may prevent or decrease complications (Diabetes Survival Skills Education) will improve 07/30/2024 1840 by Lynnette Cena CROME, RN Outcome: Progressing 07/30/2024 1830 by Lynnette Cena CROME, RN Outcome: Progressing Goal: Individualized Educational Video(s) Outcome: Progressing

## 2024-07-30 NOTE — ED Triage Notes (Signed)
 Pt arrived via [facility] c/o nausea, vomiting, diarrhea, abdominal pain and distention from yesterday. Pt speaks kinyarwanda, requires interpreter. Pt also complaining of headache.

## 2024-07-31 DIAGNOSIS — N12 Tubulo-interstitial nephritis, not specified as acute or chronic: Secondary | ICD-10-CM

## 2024-07-31 DIAGNOSIS — A419 Sepsis, unspecified organism: Secondary | ICD-10-CM | POA: Diagnosis not present

## 2024-07-31 DIAGNOSIS — K5900 Constipation, unspecified: Secondary | ICD-10-CM

## 2024-07-31 DIAGNOSIS — E119 Type 2 diabetes mellitus without complications: Secondary | ICD-10-CM

## 2024-07-31 LAB — CBC
HCT: 30.3 % — ABNORMAL LOW (ref 36.0–46.0)
Hemoglobin: 9.6 g/dL — ABNORMAL LOW (ref 12.0–15.0)
MCH: 26.4 pg (ref 26.0–34.0)
MCHC: 31.7 g/dL (ref 30.0–36.0)
MCV: 83.2 fL (ref 80.0–100.0)
Platelets: 258 K/uL (ref 150–400)
RBC: 3.64 MIL/uL — ABNORMAL LOW (ref 3.87–5.11)
RDW: 13.7 % (ref 11.5–15.5)
WBC: 14.3 K/uL — ABNORMAL HIGH (ref 4.0–10.5)
nRBC: 0 % (ref 0.0–0.2)

## 2024-07-31 LAB — BASIC METABOLIC PANEL WITH GFR
Anion gap: 12 (ref 5–15)
BUN: 9 mg/dL (ref 6–20)
CO2: 20 mmol/L — ABNORMAL LOW (ref 22–32)
Calcium: 7.7 mg/dL — ABNORMAL LOW (ref 8.9–10.3)
Chloride: 105 mmol/L (ref 98–111)
Creatinine, Ser: 0.81 mg/dL (ref 0.44–1.00)
GFR, Estimated: >60 mL/min (ref >60)
Glucose, Bld: 108 mg/dL — ABNORMAL HIGH (ref 70–99)
Potassium: 3.8 mmol/L (ref 3.5–5.1)
Sodium: 137 mmol/L (ref 135–145)

## 2024-07-31 LAB — URINE CULTURE: Culture: NO GROWTH

## 2024-07-31 LAB — GLUCOSE, CAPILLARY
Glucose-Capillary: 117 mg/dL — ABNORMAL HIGH (ref 70–99)
Glucose-Capillary: 107 mg/dL — ABNORMAL HIGH (ref 70–99)
Glucose-Capillary: 110 mg/dL — ABNORMAL HIGH (ref 70–99)
Glucose-Capillary: 125 mg/dL — ABNORMAL HIGH (ref 70–99)

## 2024-07-31 MED ORDER — POLYETHYLENE GLYCOL 3350 17 G PO PACK
17.0000 g | PACK | Freq: Every day | ORAL | Status: DC
  Administered 2024-07-31 – 2024-08-02 (×3): 17 g via ORAL
  Filled 2024-07-31 (×3): qty 1

## 2024-07-31 MED ORDER — CHLORHEXIDINE GLUCONATE CLOTH 2 % EX PADS
6.0000 | MEDICATED_PAD | Freq: Every day | CUTANEOUS | Status: DC
  Administered 2024-07-31 – 2024-08-02 (×3): 6 via TOPICAL

## 2024-07-31 MED ORDER — SENNA 8.6 MG PO TABS
1.0000 | ORAL_TABLET | Freq: Every day | ORAL | Status: DC
  Administered 2024-07-31 – 2024-08-02 (×3): 8.6 mg via ORAL
  Filled 2024-07-31 (×3): qty 1

## 2024-07-31 NOTE — Assessment & Plan Note (Deleted)
 A1c 6.4 in 2019, possibly progressed to T2DM as a risk factor for pyelonephritis as above. - A1c - CBGs with sSSI

## 2024-07-31 NOTE — Progress Notes (Signed)
 Patient refused Lovenox shot. Very hard to explain what it was for and small needle and all with translator. Informed Dr. Janna. Will pass on to day shift. Patient does not like males touching her. When day shift female nurse comes on I will get SCD's and pump and have that nurse put them on the patient.

## 2024-07-31 NOTE — Assessment & Plan Note (Addendum)
 Patient with bilateral pyelonephritis seen on CTAP.  Initially met sepsis criteria.  Vitals have been improving.  S/p cefepime and Flagyl, transition to CTX. - Continue CTX daily -adjust as indicated by culture - Follow-up blood and urine cultures - Pain management: Scheduled Tylenol 1000 every 6, oxycodone 5 as needed every 6, morphine 4 as needed every 4 for breakthrough - Zofran 4 every 8 as needed - Advance to regular diet - If tolerating p.o., will stop maintenance fluids - AM CBC, BMP

## 2024-07-31 NOTE — Assessment & Plan Note (Signed)
 Reports last bowel movement on 11/7. - MiraLAX and senna daily

## 2024-07-31 NOTE — Plan of Care (Signed)

## 2024-07-31 NOTE — Assessment & Plan Note (Signed)
 A1c 6.6 on admission.  New diagnosis of diabetes.  Previously prediabetic. - Continue sensitive SSI - Consider metformin on discharge

## 2024-07-31 NOTE — Progress Notes (Signed)
     Daily Progress Note Intern Pager: [phone]  Patient name: Maria Powell Medical record number: [id number] Date of birth: 04/17/1981 Age: 43 y.o. Gender: female  Primary Care Provider: Arloa Suzen RAMAN, NP (Inactive) Consultants: None Code Status: Full  Pt Overview and Major Events to Date:  11/8: Admitted to [facility] for sepsis with pyelonephritis    Medical Decision Making: Royden Pancake 43 y.o. F admitted for sepsis and pyelonephritis, receiving IV antibiotics.  PMH includes prediabetes and HLD. Assessment & Plan Sepsis (HCC) Bilateral Pyelonephritis Patient with bilateral pyelonephritis seen on CTAP.  Initially met sepsis criteria.  Vitals have been improving.  S/p cefepime and Flagyl, transition to CTX. - Continue CTX daily -adjust as indicated by culture - Follow-up blood and urine cultures - Pain management: Scheduled Tylenol 1000 every 6, oxycodone 5 as needed every 6, morphine 4 as needed every 4 for breakthrough - Zofran 4 every 8 as needed - Advance to regular diet - If tolerating p.o., will stop maintenance fluids - AM CBC, BMP Diabetes mellitus without complication (HCC) A1c 6.6 on admission.  New diagnosis of diabetes.  Previously prediabetic. - Continue sensitive SSI - Consider metformin on discharge Constipation Reports last bowel movement on 11/7. - MiraLAX and senna daily  FEN/GI: Regular diet PPx: SCDs per patient preference Dispo: Home pending clinical improvement  Subjective:  Patient reports doing better this morning, some abdominal and back pain remains.  Patient prefers SCDs rather than Lovenox injections for DVT prophylaxis while admitted.  Reports last bowel movement on Friday, feels somewhat backed up.  Objective: Temp:  [98.9 F (37.2 C)-100.4 F (38 C)] 98.9 F (37.2 C) (11/09 0307) Pulse Rate:  [92-117] 104 (11/09 0307) Resp:  [16-35] 16 (11/09 0307) BP: (93-106)/(61-82) 103/69 (11/09 0307) SpO2:  [92 %-100 %] 94 % (11/09  0307) Physical Exam: General: No acute distress resting comfortably in room. CV: Normal S1/S2. No extra heart sounds. Warm and well-perfused. Pulm: Breathing comfortably on room air. CTAB anteriorly. No increased WOB. Abd: Normoactive bowel sounds.  Soft, mildly distended.  Tender to palpation throughout, no rebound or guarding. Skin/Ext:  Warm, dry.  No lower extremity swelling.  Laboratory: Most recent CBC Lab Results  Component Value Date   WBC 14.3 (H) 07/31/2024   HGB 9.6 (L) 07/31/2024   HCT 30.3 (L) 07/31/2024   MCV 83.2 07/31/2024   PLT 258 07/31/2024   Most recent BMP    Latest Ref Rng & Units 07/31/2024    3:35 AM  BMP  Glucose 70 - 99 mg/dL 891   BUN 6 - 20 mg/dL 9   Creatinine 9.55 - 8.99 mg/dL 9.18   Sodium 864 - 854 mmol/L 137   Potassium 3.5 - 5.1 mmol/L 3.8   Chloride 98 - 111 mmol/L 105   CO2 22 - 32 mmol/L 20   Calcium 8.9 - 10.3 mg/dL 7.7     Diona Perkins, MD 07/31/2024, 8:08 AM  PGY-2, [place] [facility] FPTS Intern pager: [phone], text pages welcome Secure chat group [facility] Teaching Service

## 2024-08-01 DIAGNOSIS — A419 Sepsis, unspecified organism: Secondary | ICD-10-CM | POA: Diagnosis not present

## 2024-08-01 DIAGNOSIS — N12 Tubulo-interstitial nephritis, not specified as acute or chronic: Secondary | ICD-10-CM | POA: Diagnosis not present

## 2024-08-01 LAB — CBC
HCT: 30.4 % — ABNORMAL LOW (ref 36.0–46.0)
Hemoglobin: 10.2 g/dL — ABNORMAL LOW (ref 12.0–15.0)
MCH: 26.8 pg (ref 26.0–34.0)
MCHC: 33.6 g/dL (ref 30.0–36.0)
MCV: 80 fL (ref 80.0–100.0)
Platelets: 327 10*3/uL (ref 150–400)
RBC: 3.8 MIL/uL — ABNORMAL LOW (ref 3.87–5.11)
RDW: 14.1 % (ref 11.5–15.5)
WBC: 14.4 10*3/uL — ABNORMAL HIGH (ref 4.0–10.5)
nRBC: 0 % (ref 0.0–0.2)

## 2024-08-01 LAB — BASIC METABOLIC PANEL WITH GFR
Anion gap: 13 (ref 5–15)
BUN: 8 mg/dL (ref 6–20)
CO2: 22 mmol/L (ref 22–32)
Calcium: 7.8 mg/dL — ABNORMAL LOW (ref 8.9–10.3)
Chloride: 103 mmol/L (ref 98–111)
Creatinine, Ser: 0.75 mg/dL (ref 0.44–1.00)
GFR, Estimated: >60 mL/min (ref >60)
Glucose, Bld: 101 mg/dL — ABNORMAL HIGH (ref 70–99)
Potassium: 3.5 mmol/L (ref 3.5–5.1)
Sodium: 138 mmol/L (ref 135–145)

## 2024-08-01 LAB — GLUCOSE, CAPILLARY
Glucose-Capillary: 125 mg/dL — ABNORMAL HIGH (ref 70–99)
Glucose-Capillary: 91 mg/dL (ref 70–99)
Glucose-Capillary: 162 mg/dL — ABNORMAL HIGH (ref 70–99)
Glucose-Capillary: 95 mg/dL (ref 70–99)

## 2024-08-01 NOTE — Assessment & Plan Note (Addendum)
 Last bowel movement 11/10 after 3 days. - Continue MiraLAX and senna daily

## 2024-08-01 NOTE — Progress Notes (Signed)
 Daily Progress Note Intern Pager: [phone]  Patient name: Maria Powell Medical record number: [id number] Date of birth: Jun 01, 1981 Age: 43 y.o. Gender: female  Primary Care Provider: Arloa Suzen RAMAN, NP (Inactive) Consultants: None Code Status: Full  Pt Overview and Major Events to Date:  11/08: Admitted for sepsis w/pyelonephritis  Medical Decision Making:  Maria Powell is a 43 y.o. F admitted for sepsis and pyelonephritis, receiving IV antibiotics. Pertinent PMH/PSH includes prediabetes and HLD.  Assessment & Plan Sepsis (HCC) Bilateral Pyelonephritis Bilateral pyelonephritis seen on CTAP. Met sepsis criteria on admission. Afebrile overnight, VSS stable. - S/p cefepime and Flagyl in the ED.  - Continue CTX 2g daily - adjust as indicated by culture - Follow-up blood and urine cultures (of note, antibiotics were administered prior to collection of urine culture sample) - Pain management: Scheduled Tylenol 1000 every 6 hours, oxycodone 5 as needed every 6 hours, IV morphine 4mg  as needed every 4 hours for breakthrough pain - Zofran 4mg  every 8 hours as needed - Advance to regular diet and encourage p.o. hydration - AM CBC, BMP - Encourage mobility and up and out of bed - Gonorrhea and Chlamydia swab for possibility of STI causing symptoms - Discontinue Foley, obtain postvoid residual - Strict I's and O's Diabetes mellitus without complication (HCC) A1c 6.6 on admission. New diagnosis of diabetes. Previously prediabetic. - Continue SSI - Consider metformin on discharge Constipation Last bowel movement 11/10 after 3 days. - Continue MiraLAX and senna daily  FEN/GI: Regular diet  PPx: SCDs per patient preference Dispo:Home pending clinical improvement .   Subjective:  Patient was seen and evaluated at bedside.  She is lying supine in bed looking uncomfortable however she verbalizes she is feeling much better now than she did on admission.  There were no  overnight events.  She does complain of nausea and mild lower abdominal pain.  She is tolerating p.o. well and had breakfast before I came in the room to examine.  She does report 1 bowel movement since last night.  Objective: Temp:  [98.7 F (37.1 C)-98.9 F (37.2 C)] 98.8 F (37.1 C) (11/10 0421) Pulse Rate:  [91-98] 98 (11/10 0421) Resp:  [16-20] 20 (11/10 0421) BP: (105-115)/(69-75) 115/69 (11/10 0421) SpO2:  [92 %-95 %] 92 % (11/10 0421) Physical Exam: General: Uncomfortable appearing, visible sweat beads on face, under 2 blankets with heat going, no fever Cardiovascular: RRR, no M/R/G, 2+ radial pulses Respiratory: CTAB, normal work of breathing on room air, no W/R/R Abdomen: Soft, mildly distended, mild diffuse tenderness, no CVA tenderness Extremities: No peripheral edema, moves all extremities equally  Laboratory: Most recent CBC Lab Results  Component Value Date   WBC 14.4 (H) 08/01/2024   HGB 10.2 (L) 08/01/2024   HCT 30.4 (L) 08/01/2024   MCV 80.0 08/01/2024   PLT 327 08/01/2024   Most recent BMP    Latest Ref Rng & Units 08/01/2024    4:19 AM  BMP  Glucose 70 - 99 mg/dL 898   BUN 6 - 20 mg/dL 8   Creatinine 9.55 - 8.99 mg/dL 9.24   Sodium 864 - 854 mmol/L 138   Potassium 3.5 - 5.1 mmol/L 3.5   Chloride 98 - 111 mmol/L 103   CO2 22 - 32 mmol/L 22   Calcium 8.9 - 10.3 mg/dL 7.8    Lupie Credit, DO 08/01/2024, 7:24 AM  PGY-1, [place] [facility] FPTS Intern pager: [phone], text pages welcome Secure chat group [facility]  Hospital Teaching Service

## 2024-08-01 NOTE — Plan of Care (Signed)

## 2024-08-01 NOTE — Assessment & Plan Note (Addendum)
 Bilateral pyelonephritis seen on CTAP. Met sepsis criteria on admission. Afebrile overnight, VSS stable. - S/p cefepime and Flagyl in the ED.  - Continue CTX 2g daily - adjust as indicated by culture - Follow-up blood and urine cultures (of note, antibiotics were administered prior to collection of urine culture sample) - Pain management: Scheduled Tylenol 1000 every 6 hours, oxycodone 5 as needed every 6 hours, IV morphine 4mg  as needed every 4 hours for breakthrough pain - Zofran 4mg  every 8 hours as needed - Advance to regular diet and encourage p.o. hydration - AM CBC, BMP - Encourage mobility and up and out of bed - Gonorrhea and Chlamydia swab for possibility of STI causing symptoms - Discontinue Foley, obtain postvoid residual - Strict I's and O's

## 2024-08-01 NOTE — Assessment & Plan Note (Addendum)
 A1c 6.6 on admission. New diagnosis of diabetes. Previously prediabetic. - Continue SSI - Consider metformin on discharge

## 2024-08-01 NOTE — Plan of Care (Signed)
 Patient was seen by myself and Roselie Quale, RN who was chaperone for vaginal swab.  Patient tolerated procedure well.

## 2024-08-01 NOTE — Hospital Course (Signed)
 Shanayah Maeda is a 43 y.o.female with a history of HLD who was admitted to the [facility] Medicine Teaching Service at [facility] for sepsis in the setting of bilateral pyelonephritis. Her hospital course is detailed below:  Sepsis  Bilateral Pyelonephritis The patient presented to the ED with abdominal pain and nausea meeting sepsis criteria with tachycardia, soft BP, and leukocytosis.  Bilateral pyelonephritis was confirmed on CTAP.  Patient was given cefepime and Flagyl in the ED and started on maintenance IV fluids with ceftriaxone 2 g daily continued on the floor.  Blood and urine cultures were no growth to date on discharge.  The patient did well on IV antibiotics and remained afebrile throughout hospital course.  Gonorrhea and chlamydia swabs pending on discharge.  The patient was discharged on 5-day course of cefadroxil 500 mg twice daily for a total of 7 days on antibiotics.  Hospital follow-up with PCP was made prior to discharge.  Diabetes mellitus without complication  Patient presented with an A1c of 6.6 on admission which is a new diagnosis of diabetes for her.  She was placed on sliding scale insulin throughout hospital course and did well with blood glucose between 120s - 150s.  Metformin was considered but not started on discharge.  Constipation 3 days of constipation relieved with MiraLAX and senna and normal bowel movements on discharge.  PCP Follow-up Recommendations: Return-to-work letter needed from PCP Consider need for metformin Review daily bowel regimen

## 2024-08-02 ENCOUNTER — Encounter: Payer: Self-pay | Admitting: Family Medicine

## 2024-08-02 ENCOUNTER — Other Ambulatory Visit (HOSPITAL_COMMUNITY): Payer: Self-pay

## 2024-08-02 ENCOUNTER — Ambulatory Visit: Payer: Self-pay | Admitting: Family Medicine

## 2024-08-02 DIAGNOSIS — N12 Tubulo-interstitial nephritis, not specified as acute or chronic: Secondary | ICD-10-CM | POA: Diagnosis not present

## 2024-08-02 LAB — BASIC METABOLIC PANEL WITH GFR
Anion gap: 11 (ref 5–15)
BUN: 7 mg/dL (ref 6–20)
CO2: 23 mmol/L (ref 22–32)
Calcium: 8.1 mg/dL — ABNORMAL LOW (ref 8.9–10.3)
Chloride: 104 mmol/L (ref 98–111)
Creatinine, Ser: 0.72 mg/dL (ref 0.44–1.00)
GFR, Estimated: >60 mL/min (ref >60)
Glucose, Bld: 90 mg/dL (ref 70–99)
Potassium: 3.8 mmol/L (ref 3.5–5.1)
Sodium: 138 mmol/L (ref 135–145)

## 2024-08-02 LAB — CERVICOVAGINAL ANCILLARY ONLY
Bacterial Vaginitis (gardnerella): POSITIVE — AB
Candida Glabrata: NEGATIVE
Candida Vaginitis: POSITIVE — AB
Chlamydia: NEGATIVE
Comment: NEGATIVE
Comment: NEGATIVE
Comment: NEGATIVE
Comment: NEGATIVE
Comment: NEGATIVE
Comment: NORMAL
Neisseria Gonorrhea: NEGATIVE
Trichomonas: NEGATIVE

## 2024-08-02 LAB — CBC
HCT: 29.8 % — ABNORMAL LOW (ref 36.0–46.0)
Hemoglobin: 9.9 g/dL — ABNORMAL LOW (ref 12.0–15.0)
MCH: 26.6 pg (ref 26.0–34.0)
MCHC: 33.2 g/dL (ref 30.0–36.0)
MCV: 80.1 fL (ref 80.0–100.0)
Platelets: 363 K/uL (ref 150–400)
RBC: 3.72 MIL/uL — ABNORMAL LOW (ref 3.87–5.11)
RDW: 14.2 % (ref 11.5–15.5)
WBC: 11.9 K/uL — ABNORMAL HIGH (ref 4.0–10.5)
nRBC: 0 % (ref 0.0–0.2)

## 2024-08-02 LAB — GLUCOSE, CAPILLARY
Glucose-Capillary: 144 mg/dL — ABNORMAL HIGH (ref 70–99)
Glucose-Capillary: 126 mg/dL — ABNORMAL HIGH (ref 70–99)

## 2024-08-02 MED ORDER — CEFADROXIL 500 MG PO CAPS
1000.0000 mg | ORAL_CAPSULE | Freq: Two times a day (BID) | ORAL | 0 refills | Status: AC
  Filled 2024-08-02: qty 20, 5d supply, fill #0

## 2024-08-02 MED ORDER — OXYCODONE HCL 5 MG PO TABS
5.0000 mg | ORAL_TABLET | Freq: Four times a day (QID) | ORAL | 0 refills | Status: AC | PRN
  Filled 2024-08-02: qty 10, 3d supply, fill #0

## 2024-08-02 NOTE — Discharge Summary (Addendum)
 Family Medicine Teaching [facility] Discharge Summary  Patient name: Maria Powell Medical record number: [id number] Date of birth: 1980/11/18 Age: 43 y.o. Gender: female Date of Admission: 07/30/2024  Date of Discharge: 08/02/2024 Admitting Physician: Otto ONEIDA Fairly, MD  Primary Care Provider: Arloa Suzen RAMAN, NP (Inactive) Consultants: None  Indication for Hospitalization: sepsis in the setting of bilateral pyelonephritis  Brief Hospital Course:  Maria Powell is a 43 y.o.female with a history of HLD who was admitted to the [facility] Medicine Teaching Service at [facility] for sepsis in the setting of bilateral pyelonephritis. Her hospital course is detailed below:  Sepsis  Bilateral Pyelonephritis The patient presented to the ED with abdominal pain and nausea meeting sepsis criteria with tachycardia, soft BP, and leukocytosis.  Bilateral pyelonephritis was confirmed on CTAP.  Patient was given cefepime and Flagyl in the ED and started on maintenance IV fluids with ceftriaxone 2 g daily continued on the floor.  Blood and urine cultures were no growth to date on discharge.  The patient did well on IV antibiotics and remained afebrile throughout hospital course.  Gonorrhea and chlamydia swabs pending on discharge.  The patient was discharged on 5-day course of cefadroxil 500 mg twice daily for a total of 7 days on antibiotics.  Hospital follow-up with PCP was made prior to discharge.  Diabetes mellitus without complication  Patient presented with an A1c of 6.6 on admission which is a new diagnosis of diabetes for her.  She was placed on sliding scale insulin throughout hospital course and did well with blood glucose between 120s - 150s.  Metformin was considered but not started on discharge.  Constipation 3 days of constipation relieved with MiraLAX and senna and normal bowel movements on discharge.  PCP Follow-up Recommendations: Return-to-work letter needed from  PCP Consider need for metformin Review daily bowel regimen   Discharge Diagnoses/Problem List:  Hospital Problems      Hospital     * (Principal) Sepsis [facility])     Bilateral Pyelonephritis     Prediabetes     Diabetes mellitus without complication (HCC)   Disposition: Home   Discharge Condition: Stable  Discharge Exam:  General: well appearing sleeping female easily awoken in no acute distress Cardiovascular: RRR, no m/g/r, 2+ radial pulses Respiratory: CTAB, normal WOB on RA, no w/r/r Abdomen: No CVA tenderness, no abdominal tenderness, soft, non-distended Extremities: no peripheral edema, moves all extremities equally  Significant Labs and Imaging:  Recent Labs  Lab 08/01/24 0419 08/02/24 0441  WBC 14.4* 11.9*  HGB 10.2* 9.9*  HCT 30.4* 29.8*  PLT 327 363   Recent Labs  Lab 08/01/24 0419 08/02/24 0441  NA 138 138  K 3.5 3.8  CL 103 104  CO2 22 23  GLUCOSE 101* 90  BUN 8 7  CREATININE 0.75 0.72  CALCIUM 7.8* 8.1*   Results/Tests Pending at Time of Discharge: g/c vaginal swab  Discharge Medications:  Allergies as of 08/02/2024   No Known Allergies      Medication List     TAKE these medications    cefadroxil 500 MG capsule Commonly known as: DURICEF Take 2 capsules (1,000 mg total) by mouth 2 (two) times daily for 5 days.   oxyCODONE 5 MG immediate release tablet Commonly known as: Oxy IR/ROXICODONE Take 1 tablet (5 mg total) by mouth every 6 (six) hours as needed for moderate pain (pain score 4-6) or severe pain (pain score 7-10).       Discharge Instructions: Please refer to  Patient Instructions section of EMR for full details.  Patient was counseled important signs and symptoms that should prompt return to medical care, changes in medications, dietary instructions, activity restrictions, and follow up appointments.   Follow-Up Appointments:  Follow-up Information     McElwee, Lauren A, NP Follow up.   Specialty: Internal Medicine Why:  TIME : 8:40 AM   PLEASE ARRIVE AT 8:15 AM DATE : [id number] , 2025 TUESDAY  PLEASE BRING ALL CURRENT MEDICATION, ID and INS CARD PT'S WILL BE PLACED ON AND EARLY LISTFOR APPT. Contact information: [address] [place] [address] [phone]                Lupie Credit, DO 08/02/2024, 1:01 PM PGY-1, [facility] Family Medicine  I have verified that the service and findings are accurately documented in the student's note.  Damien Cassis, MD                  08/02/2024, 2:55 PM

## 2024-08-02 NOTE — Progress Notes (Signed)
     Daily Progress Note Intern Pager: [phone]  Patient name: Maria Powell Medical record number: [id number] Date of birth: 13-Oct-1980 Age: 43 y.o. Gender: female  Primary Care Provider: Arloa Suzen RAMAN, NP (Inactive) Consultants: None Code Status: Full  Pt Overview and Major Events to Date:  11/08: Admitted for sepsis in the setting of bilateral pyelonephritis  Assessment and Plan:  Maria Powell is a 43 y.o. F admitted for sepsis and pyelonephritis, receiving IV antibiotics. Pertinent PMH/PSH includes prediabetes and HLD.  Assessment & Plan Sepsis (HCC) Bilateral Pyelonephritis Bilateral pyelonephritis seen on CTAP. Met sepsis criteria on admission. Afebrile throughout hospital course, VSS stable. - Continue CTX 2g daily - adjust as indicated by culture - Follow-up blood and urine cultures NGTD (of note, antibiotics were administered prior to collection of urine culture sample) - Pain management: Continue Scheduled Tylenol 1000 every 6 hours, oxycodone 5 as needed every 6 hours, IV morphine 4mg  as needed every 4 hours for breakthrough pain - Continue Zofran 4mg  every 8 hours as needed - Encourage mobility and up and out of bed - Gonorrhea and Chlamydia swab for possibility of STI causing symptoms pending - Strict I's and O's Diabetes mellitus without complication (HCC) A1c 6.6 on admission. New diagnosis of diabetes. Previously prediabetic. - Continue SSI - Consider metformin on discharge Constipation (Resolved: 08/02/2024) Last bowel movement 11/10 after 3 days. - Continue MiraLAX and senna daily  FEN/GI: Regular diet PPx: SCDs per patient preference Dispo: Home today on oral abx.   Subjective:  Patient was seen and evaluated at bedside. There were no overnight events. She remained afebrile with stable VS throughout the night. She looks clinically better than yesterday and states she feels well and would like to go home. Denies abdominal or flank pain.  Urinating fine since discontinuation of foley yesterday, reports normal bowel movements.   Objective: Temp:  [98.2 F (36.8 C)-98.9 F (37.2 C)] 98.6 F (37 C) (11/11 0445) Pulse Rate:  [70-82] 70 (11/11 0445) Resp:  [16-19] 18 (11/11 0445) BP: (110-127)/(76-83) 127/76 (11/11 0445) Physical Exam: General: well appearing sleeping female easily awoken in no acute distress Cardiovascular: RRR, no m/g/r, 2+ radial pulses Respiratory: CTAB, normal WOB on RA, no w/r/r Abdomen: No CVA tenderness, no abdominal tenderness, soft, non-distended Extremities: no peripheral edema, moves all extremities equally  Laboratory: Most recent CBC Lab Results  Component Value Date   WBC 11.9 (H) 08/02/2024   HGB 9.9 (L) 08/02/2024   HCT 29.8 (L) 08/02/2024   MCV 80.1 08/02/2024   PLT 363 08/02/2024   Most recent BMP    Latest Ref Rng & Units 08/02/2024    4:41 AM  BMP  Glucose 70 - 99 mg/dL 90   BUN 6 - 20 mg/dL 7   Creatinine 9.55 - 8.99 mg/dL 9.27   Sodium 864 - 854 mmol/L 138   Potassium 3.5 - 5.1 mmol/L 3.8   Chloride 98 - 111 mmol/L 104   CO2 22 - 32 mmol/L 23   Calcium 8.9 - 10.3 mg/dL 8.1    Maria Credit, DO 08/02/2024, 8:14 AM  PGY-1, [facility] Family Medicine FPTS Intern pager: [phone], text pages welcome Secure chat group [facility] Teaching Service

## 2024-08-02 NOTE — Telephone Encounter (Signed)
 Interpreter PI#[id number] Unable to reach her on both numbers listed on File. HIPAA complaint message left.  Advised PCP follow up to discuss test results.   Note. Ancillary cervical swab test return positive with BV and Candida. No complaints of vaginal discharge on admission. Consider treatment. Attempt made to reach her to discuss was unsuccessful. PCP copied to follow with patient.

## 2024-08-02 NOTE — Plan of Care (Signed)

## 2024-08-02 NOTE — Assessment & Plan Note (Addendum)
 Bilateral pyelonephritis seen on CTAP. Met sepsis criteria on admission. Afebrile throughout hospital course, VSS stable. - Continue CTX 2g daily - adjust as indicated by culture - Follow-up blood and urine cultures NGTD (of note, antibiotics were administered prior to collection of urine culture sample) - Pain management: Continue Scheduled Tylenol 1000 every 6 hours, oxycodone 5 as needed every 6 hours, IV morphine 4mg  as needed every 4 hours for breakthrough pain - Continue Zofran 4mg  every 8 hours as needed - Encourage mobility and up and out of bed - Gonorrhea and Chlamydia swab for possibility of STI causing symptoms pending - Strict I's and O's

## 2024-08-02 NOTE — TOC CM/SW Note (Signed)
 Transition of Care [facility]) - Inpatient Brief Assessment   Patient Details  Name: Maria Powell MRN: [id number] Date of Birth: 1981-04-30  [facility] CM/SW Contact:    Sudie Erminio Deems, RN Phone Number: 08/02/2024, 12:17 PM   Clinical Narrative: Patient presented for sepsis. PTA Patient was from home with family support of sons Bandgang @ [phone]. Patient has insurance Medicaid and PCP has been arranged at [facility] for [facility] follow up. No further needs identified at this time.    Transition of Care Asessment: Insurance and Status: Insurance coverage has been reviewed Patient has primary care physician:  (PCP appointment arragned with [employer].) Prior level of function:: independent Prior/Current Home Services: No current home services Social Drivers of Health Review: SDOH reviewed no interventions necessary Readmission risk has been reviewed: Yes Transition of care needs: no transition of care needs at this time

## 2024-08-02 NOTE — Assessment & Plan Note (Signed)
 A1c 6.6 on admission. New diagnosis of diabetes. Previously prediabetic. - Continue SSI - Consider metformin on discharge

## 2024-08-02 NOTE — Discharge Instructions (Addendum)
 We are so glad you are feeling better! You were diagnosed and treated for pyelonephritis (kidney infection).   Take cefadroxil 500mg  twice a day for 5 days to complete your course of antibiotics for this infection. Do not skip doses or stop early.   Drink plenty of water throughout the day to help flush bacteria from your kidneys. Avoid alcohol and caffeine until you're fully recovered.   You may use Tylenol and Motrin as needed for pain.   Return to the emergency room for: -fever >101 that doesn't go away  -if you have worsening back, flank, or abdominal pain -you can't keep fluids or medicine down due to vomiting -you notice blood in your urine or decreased urine output -you feel very weak, confused, or faint  Turishimye cyane kuba wumva umerewe neza! Wapimwe kandi uvura pyelonephritis (kwandura impyiko).   Fata cefadroxil 500mg  kabiri kumunsi muminsi 5 kugirango urangize inzira ya antibiotique kuriyi ndwara. Ntusibe dosiye cyangwa ngo uhagarare hakiri kare.   Kunywa amazi menshi umunsi wose kugirango ufashe gukuramo bagiteri zimpyiko. Irinde inzoga na cafine kugeza ukize neza.   Urashobora gukoresha Tylenol na Motrin nkuko bikenewe kubabara.   Garuka mucyumba cyihutirwa cya: -umuriro> 101 ibyo ntibigenda  -niba ufite ububabare bukabije [place], [place], cyangwa kubabara munda -Ntushobora kubika amazi cyangwa imiti hasi kubera kuruka -ubona amaraso muminkari yawe cyangwa kugabanuka kwinkari -urumva ufite intege nke cyane, urujijo, cyangwa gucika intege

## 2024-08-02 NOTE — Assessment & Plan Note (Signed)
 Last bowel movement 11/10 after 3 days. - Continue MiraLAX and senna daily

## 2024-08-04 LAB — CULTURE, BLOOD (ROUTINE X 2)
Culture: NO GROWTH
Culture: NO GROWTH
Special Requests: ADEQUATE

## 2024-09-13 ENCOUNTER — Ambulatory Visit: Admitting: Nurse Practitioner

## 2024-11-25 ENCOUNTER — Ambulatory Visit: Admitting: Family Medicine
